# Patient Record
Sex: Male | Born: 2000 | Race: Black or African American | Hispanic: No | Marital: Single | Smoking: Former smoker
Health system: Southern US, Community
[De-identification: ages and names within clinical notes are randomized; demographics above are authoritative.]

## PROBLEM LIST (undated history)

## (undated) DIAGNOSIS — Z789 Other specified health status: Secondary | ICD-10-CM

## (undated) DIAGNOSIS — J45909 Unspecified asthma, uncomplicated: Secondary | ICD-10-CM

## (undated) HISTORY — PX: HIP SURGERY: SHX245

## (undated) HISTORY — DX: Other specified health status: Z78.9

---
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
## 2020-02-14 ENCOUNTER — Other Ambulatory Visit: Payer: Self-pay | Admitting: Nurse Practitioner

## 2020-02-14 ENCOUNTER — Ambulatory Visit
Admission: RE | Admit: 2020-02-14 | Discharge: 2020-02-14 | Disposition: A | Discharge status: Home / Self Care | Payer: No Typology Code available for payment source | Source: Ambulatory Visit | Attending: Nurse Practitioner | Admitting: Nurse Practitioner

## 2020-02-14 DIAGNOSIS — Z021 Encounter for pre-employment examination: Secondary | ICD-10-CM

## 2021-05-04 ENCOUNTER — Encounter (HOSPITAL_COMMUNITY): Payer: Self-pay

## 2021-05-04 ENCOUNTER — Other Ambulatory Visit: Payer: Self-pay

## 2021-05-04 ENCOUNTER — Emergency Department (HOSPITAL_COMMUNITY): Payer: 59

## 2021-05-04 ENCOUNTER — Emergency Department (HOSPITAL_COMMUNITY)
Admission: EM | Admit: 2021-05-04 | Discharge: 2021-05-04 | Disposition: A | Discharge status: Home / Self Care | Payer: 59 | Attending: Emergency Medicine | Admitting: Emergency Medicine

## 2021-05-04 DIAGNOSIS — N433 Hydrocele, unspecified: Secondary | ICD-10-CM | POA: Insufficient documentation

## 2021-05-04 DIAGNOSIS — N50812 Left testicular pain: Secondary | ICD-10-CM | POA: Diagnosis present

## 2021-05-04 NOTE — ED Notes (Signed)
Unable to provide urine specimen.  

## 2021-05-04 NOTE — Discharge Instructions (Addendum)
You are seen in the emergency department due to possible lump to your left testicle.  Your physical exam was overall reassuring.  Your ultrasound did show findings of a very small hydrocele, please see attached handout.   Please follow-up with urology, call tomorrow for soonest available follow-up appointment.  We have also provided a phone number to call to establish local primary care.  Return to the emergency department for new or worsening symptoms including but not limited to new or worsening pain, fever, problems urinating, penile discharge, testicular swelling, pain with bowel movements, or any other concerns.

## 2021-05-04 NOTE — ED Notes (Signed)
Ultrasound at bedside

## 2021-05-04 NOTE — ED Provider Notes (Signed)
[facility]-EMERGENCY DEPT Provider Note   CSN: [id number] Arrival date & time: 05/04/21  0174     History Chief Complaint  Patient presents with   Testicle Pain    Alex Wade is a 20 y.o. male without significant past medical hx who presents to the ED with concern for lump to his left testicle that he noted within the past 24 hours. States the area is uncomfortable but not particularly painful. No alleviating/aggravating factors. Denies injury, fever, chills, redness, drainage, N/V, abdominal pain, dysuria, penile discharge, or concern for STI.   HPI     History reviewed. No pertinent past medical history.  There are no problems to display for this patient.   History reviewed. No pertinent surgical history.     History reviewed. No pertinent family history.  Social History   Tobacco Use   Smoking status: Never   Smokeless tobacco: Never  Substance Use Topics   Alcohol use: Never   Drug use: Never    Home Medications Prior to Admission medications   Not on File    Allergies    Naproxen  Review of Systems   Review of Systems  Constitutional:  Negative for chills and fever.  Gastrointestinal:  Negative for abdominal pain, nausea, rectal pain and vomiting.  Genitourinary:  Positive for testicular pain (discomfort with lump). Negative for dysuria, flank pain, frequency, genital sores, hematuria, penile discharge, scrotal swelling and urgency.  All other systems reviewed and are negative.  Physical Exam Updated Vital Signs BP (!) 144/93 (BP Location: Right Arm)   Pulse 68   Temp 98.7 F (37.1 C) (Oral)   Resp 18   Ht 5\' 10"  (1.778 m)   Wt 64.4 kg   SpO2 100%   BMI 20.37 kg/m   Physical Exam Vitals and nursing note reviewed. Exam conducted with a chaperone present.  Constitutional:      General: He is not in acute distress.    Appearance: He is well-developed. He is not toxic-appearing.  HENT:     Head: Normocephalic and  atraumatic.  Eyes:     General:        Right eye: No discharge.        Left eye: No discharge.     Conjunctiva/sclera: Conjunctivae normal.  Cardiovascular:     Rate and Rhythm: Normal rate and regular rhythm.  Pulmonary:     Effort: Pulmonary effort is normal. No respiratory distress.     Breath sounds: Normal breath sounds. No wheezing, rhonchi or rales.  Abdominal:     General: There is no distension.     Palpations: Abdomen is soft.     Tenderness: There is no abdominal tenderness.  Genitourinary:    Pubic Area: No rash.      Penis: No phimosis, paraphimosis, erythema, discharge or swelling.      Testes:        Right: Mass or tenderness not present.        Left: Mass or tenderness not present.     Epididymis:     Right: No tenderness.     Left: No tenderness.     Comments: No erythema/drainage/warmth/fluctuance/induration. Nontender.  NT present as chaperone.  Musculoskeletal:     Cervical back: Neck supple.  Skin:    General: Skin is warm and dry.     Findings: No rash.  Neurological:     Mental Status: He is alert.     Comments: Clear speech.   Psychiatric:  Behavior: Behavior normal.    ED Results / Procedures / Treatments   Labs (all labs ordered are listed, but only abnormal results are displayed) Labs Reviewed  URINALYSIS, ROUTINE W REFLEX MICROSCOPIC    EKG None  Radiology US SCROTUM W/DOPPLER  Result Date: 05/04/2021 CLINICAL DATA:  20 year old male with testicular mass. EXAM: SCROTAL ULTRASOUND DOPPLER ULTRASOUND OF THE TESTICLES TECHNIQUE: Complete ultrasound examination of the testicles, epididymis, and other scrotal structures was performed. Color and spectral Doppler ultrasound were also utilized to evaluate blood flow to the testicles. COMPARISON:  None. FINDINGS: Right testicle Measurements: 4.9 x 2.0 x 3.1 cm. No mass or microlithiasis visualized. Left testicle Measurements: 4.9 x 1.9 x 2.8 cm. No mass or microlithiasis visualized.  Right epididymis:  Normal in size and appearance. Left epididymis:  Normal in size and appearance. Hydrocele:  Trace left side hydrocele (image 46). Varicocele:  None visualized. Pulsed Doppler interrogation of both testes demonstrates normal low resistance arterial and venous waveforms bilaterally. Other findings: The patient was unable to palpate, locate the area of the mass for this exam. IMPRESSION: 1. Negative Scrotal Ultrasound with Doppler; trace left hydrocele but negative for testicular mass or torsion. 2. The patient was unable to palpate the area of the mass for this exam. Electronically Signed   By: Odessa Fleming M.D.   On: 05/04/2021 04:58    Procedures Procedures   Medications Ordered in ED Medications - No data to display  ED Course  I have reviewed the triage vital signs and the nursing notes.  Pertinent labs & imaging results that were available during my care of the patient were reviewed by me and considered in my medical decision making (see chart for details).    MDM Rules/Calculators/A&P                           Patient presents to the ED with complaints of concern for left testicular lump. Nontoxic, vitals w/ elevated BP- doubt HTN emergency.Exam is benign.   Additional history obtained:  Additional history obtained from chart review & nursing note review.   Imaging Studies ordered:  I ordered imaging studies which included testicular US w/ doppler, I independently reviewed, formal radiology impression shows:  1. Negative Scrotal Ultrasound with Doppler; trace left hydrocele but negative for testicular mass or torsion. 2. The patient was unable to palpate the area of the mass for this exam  ED Course:  Exam w/o findings of infection- no cellulitis/abscess.  [place] without torsion or mass.  [place] with trace left hydrocele.  Discussed [place] results and subsequently discussed UA ordered, patient without urinary sxs or penile discharge or recent sexual activity/concern for STI, he  would like to just go home as he does not feel he needs to urinate, I have a very low suspicion for STI, orchitis, epididymitis, or UTI based on exam, [place], and sxs. Overall appears appropriate for discharge home, will provide urology follow up. I discussed results, treatment plan, need for follow-up, and return precautions with the patient. Provided opportunity for questions, patient confirmed understanding and is in agreement with plan.   Portions of this note were generated with Scientist, clinical (histocompatibility and immunogenetics). Dictation errors may occur despite best attempts at proofreading.  Final Clinical Impression(s) / ED Diagnoses Final diagnoses:  Hydrocele, unspecified hydrocele type    Rx / DC Orders ED Discharge Orders     None        Cherly Anderson, PA-C 05/04/21 [phone]  Gilda Crease, MD 05/04/21 [phone]

## 2021-05-04 NOTE — ED Triage Notes (Signed)
Pt reports feeling a lump on testicles today. Uncomfortable but denies pain. Denies injury.Pt unable to visualize.

## 2021-05-20 ENCOUNTER — Encounter (HOSPITAL_COMMUNITY): Payer: Self-pay | Admitting: Emergency Medicine

## 2021-05-20 ENCOUNTER — Emergency Department (HOSPITAL_COMMUNITY): Payer: No Typology Code available for payment source

## 2021-05-20 ENCOUNTER — Other Ambulatory Visit: Payer: Self-pay

## 2021-05-20 ENCOUNTER — Emergency Department (HOSPITAL_COMMUNITY)
Admission: EM | Admit: 2021-05-20 | Discharge: 2021-05-20 | Disposition: A | Discharge status: Home / Self Care | Payer: No Typology Code available for payment source | Attending: Emergency Medicine | Admitting: Emergency Medicine

## 2021-05-20 DIAGNOSIS — S0101XA Laceration without foreign body of scalp, initial encounter: Secondary | ICD-10-CM | POA: Insufficient documentation

## 2021-05-20 DIAGNOSIS — Y92410 Unspecified street and highway as the place of occurrence of the external cause: Secondary | ICD-10-CM | POA: Insufficient documentation

## 2021-05-20 DIAGNOSIS — S0990XA Unspecified injury of head, initial encounter: Secondary | ICD-10-CM | POA: Diagnosis present

## 2021-05-20 DIAGNOSIS — J45909 Unspecified asthma, uncomplicated: Secondary | ICD-10-CM | POA: Insufficient documentation

## 2021-05-20 HISTORY — DX: Unspecified asthma, uncomplicated: J45.909

## 2021-05-20 MED ORDER — LIDOCAINE-EPINEPHRINE (PF) 2 %-1:200000 IJ SOLN
10.0000 mL | Freq: Once | INTRAMUSCULAR | Status: AC
  Administered 2021-05-20: 10 mL
  Filled 2021-05-20: qty 20

## 2021-05-20 MED ORDER — TETANUS-DIPHTH-ACELL PERTUSSIS 5-2.5-18.5 LF-MCG/0.5 IM SUSY
0.5000 mL | PREFILLED_SYRINGE | Freq: Once | INTRAMUSCULAR | Status: DC
  Filled 2021-05-20: qty 0.5

## 2021-05-20 MED ORDER — METHOCARBAMOL 500 MG PO TABS: 500.0000 mg | ORAL_TABLET | Freq: Two times a day (BID) | ORAL | 0 refills | Status: DC
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
## 2021-05-20 NOTE — Discharge Instructions (Addendum)
1. Medications: Tylenol or ibuprofen for pain, usual home medications °2. Treatment: ice for swelling, keep wound clean with warm soap and water and keep bandage dry, do not submerge in water for 24 hours °3. Follow Up: Please return in 5-7 days to have your stitches/staples removed or sooner if you have concerns. Return to the emergency department for increased redness, drainage of pus from the wound ° ° °WOUND CARE °• Keep area clean and dry for 24 hours. Do not remove bandage, if applied. °• After 24 hours, remove bandage and wash wound gently with mild soap and warm water. Reapply a new bandage after cleaning wound, if directed.  °• Continue daily cleansing with soap and water until stitches/staples are removed. °• Do not apply any ointments or creams to the wound while stitches/staples are in place, as this may cause delayed healing. °•Return if you experience any of the following signs of infection: Swelling, redness, pus drainage, streaking, fever >101.0 F °• Return if you experience excessive bleeding that does not stop after 15-20 minutes of constant, firm pressure. ° °
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
## 2021-09-24 ENCOUNTER — Other Ambulatory Visit: Payer: Self-pay

## 2021-09-24 ENCOUNTER — Encounter (HOSPITAL_BASED_OUTPATIENT_CLINIC_OR_DEPARTMENT_OTHER): Payer: Self-pay | Admitting: Emergency Medicine

## 2021-09-24 ENCOUNTER — Emergency Department (HOSPITAL_BASED_OUTPATIENT_CLINIC_OR_DEPARTMENT_OTHER)
Admission: EM | Admit: 2021-09-24 | Discharge: 2021-09-24 | Disposition: A | Discharge status: Home / Self Care | Payer: 59 | Attending: Emergency Medicine | Admitting: Emergency Medicine

## 2021-09-24 DIAGNOSIS — R519 Headache, unspecified: Secondary | ICD-10-CM | POA: Diagnosis not present

## 2021-09-24 DIAGNOSIS — E860 Dehydration: Secondary | ICD-10-CM

## 2021-09-24 DIAGNOSIS — Z20822 Contact with and (suspected) exposure to covid-19: Secondary | ICD-10-CM | POA: Insufficient documentation

## 2021-09-24 DIAGNOSIS — R000 Tachycardia, unspecified: Secondary | ICD-10-CM | POA: Insufficient documentation

## 2021-09-24 DIAGNOSIS — R197 Diarrhea, unspecified: Secondary | ICD-10-CM | POA: Insufficient documentation

## 2021-09-24 DIAGNOSIS — R1110 Vomiting, unspecified: Secondary | ICD-10-CM | POA: Insufficient documentation

## 2021-09-24 DIAGNOSIS — R531 Weakness: Secondary | ICD-10-CM | POA: Diagnosis not present

## 2021-09-24 DIAGNOSIS — R109 Unspecified abdominal pain: Secondary | ICD-10-CM | POA: Insufficient documentation

## 2021-09-24 LAB — CBC
HCT: 47.6 % (ref 39.0–52.0)
Hemoglobin: 16.5 g/dL (ref 13.0–17.0)
MCH: 30.6 pg (ref 26.0–34.0)
MCHC: 34.7 g/dL (ref 30.0–36.0)
MCV: 88.1 fL (ref 80.0–100.0)
Platelets: 195 10*3/uL (ref 150–400)
RBC: 5.4 MIL/uL (ref 4.22–5.81)
RDW: 12.7 % (ref 11.5–15.5)
WBC: 10.5 10*3/uL (ref 4.0–10.5)
nRBC: 0 % (ref 0.0–0.2)

## 2021-09-24 LAB — COMPREHENSIVE METABOLIC PANEL
ALT: 27 U/L (ref 0–44)
AST: 25 U/L (ref 15–41)
Albumin: 4.8 g/dL (ref 3.5–5.0)
Alkaline Phosphatase: 80 U/L (ref 38–126)
Anion gap: 12 (ref 5–15)
BUN: 18 mg/dL (ref 6–20)
CO2: 22 mmol/L (ref 22–32)
Calcium: 10.4 mg/dL — ABNORMAL HIGH (ref 8.9–10.3)
Chloride: 102 mmol/L (ref 98–111)
Creatinine, Ser: 1.09 mg/dL (ref 0.61–1.24)
GFR, Estimated: >60 mL/min (ref >60)
Glucose, Bld: 103 mg/dL — ABNORMAL HIGH (ref 70–99)
Potassium: 4.4 mmol/L (ref 3.5–5.1)
Sodium: 136 mmol/L (ref 135–145)
Total Bilirubin: 1.1 mg/dL (ref 0.3–1.2)
Total Protein: 7.8 g/dL (ref 6.5–8.1)

## 2021-09-24 LAB — RESP PANEL BY RT-PCR (FLU A&B, COVID) ARPGX2
Influenza A by PCR: NEGATIVE
Influenza B by PCR: NEGATIVE
SARS Coronavirus 2 by RT PCR: NEGATIVE

## 2021-09-24 LAB — LIPASE, BLOOD: Lipase: 13 U/L (ref 11–51)

## 2021-09-24 MED ORDER — LACTATED RINGERS IV SOLN: INTRAVENOUS | Status: DC

## 2021-09-24 MED ORDER — KETOROLAC TROMETHAMINE 30 MG/ML IJ SOLN
30.0000 mg | Freq: Once | INTRAMUSCULAR | Status: AC
  Administered 2021-09-24: 30 mg via INTRAVENOUS
  Filled 2021-09-24: qty 1

## 2021-09-24 MED ORDER — METOCLOPRAMIDE HCL 5 MG/ML IJ SOLN
5.0000 mg | Freq: Once | INTRAMUSCULAR | Status: AC
  Administered 2021-09-24: 5 mg via INTRAVENOUS
  Filled 2021-09-24: qty 2

## 2021-09-24 MED ORDER — LOPERAMIDE HCL 2 MG PO CAPS
2.0000 mg | ORAL_CAPSULE | ORAL | Status: DC | PRN
  Administered 2021-09-24: 2 mg via ORAL
  Filled 2021-09-24: qty 1

## 2021-09-24 MED ORDER — LACTATED RINGERS IV BOLUS
1000.0000 mL | Freq: Once | INTRAVENOUS | Status: AC
  Administered 2021-09-24: 1000 mL via INTRAVENOUS

## 2021-09-24 MED ORDER — ONDANSETRON HCL 4 MG/2ML IJ SOLN
4.0000 mg | Freq: Once | INTRAMUSCULAR | Status: AC
  Administered 2021-09-24: 4 mg via INTRAVENOUS
  Filled 2021-09-24: qty 2

## 2021-09-24 MED ORDER — ONDANSETRON 8 MG PO TBDP: 8.0000 mg | ORAL_TABLET | Freq: Three times a day (TID) | ORAL | 0 refills | Status: DC | PRN

## 2021-09-24 MED ORDER — LACTATED RINGERS IV BOLUS
2000.0000 mL | Freq: Once | INTRAVENOUS | Status: AC
  Administered 2021-09-24: 2000 mL via INTRAVENOUS

## 2021-09-24 MED ORDER — DIPHENOXYLATE-ATROPINE 2.5-0.025 MG PO TABS: 1.0000 | ORAL_TABLET | Freq: Four times a day (QID) | ORAL | 0 refills | Status: DC | PRN

## 2021-09-24 NOTE — ED Triage Notes (Signed)
Pt arrives to ED with c/o emesis and diarrhea. Pt reports that started suddenly today at 8am. Emesis >10x and diarrhea >10x. Associated symptoms include abd pain which started after emesis onset. Emesis is green tinged. Pt reports eating chipotle yesterday and it upsetting his stomach.

## 2021-09-24 NOTE — ED Provider Notes (Signed)
[facility] EMERGENCY DEPT Provider Note   CSN: [id number] Arrival date & time: 09/24/21  1227     History  Chief Complaint  Patient presents with   Emesis   Diarrhea    Alex Wade is a 21 y.o. male.  21 year old male presents with cute onset of vomiting or diarrhea.  States he may have had a bad food exposure.  Diarrhea is been watery and his emesis been nonbilious.  Some abdominal cramping but no persistent pain.  Denies any fever or chills.  No URI symptoms.  Does endorse some weakness.  No treatment use prior to arrival      Home Medications Prior to Admission medications   Medication Sig Start Date End Date Taking? Authorizing Provider  methocarbamol (ROBAXIN) 500 MG tablet Take 1 tablet (500 mg total) by mouth 2 (two) times daily. 05/20/21   Muthersbaugh, Dahlia Client, PA-C      Allergies    Naproxen    Review of Systems   Review of Systems  All other systems reviewed and are negative.  Physical Exam Updated Vital Signs BP 115/61 (BP Location: Left Arm)    Pulse (!) 121    Temp 99.5 F (37.5 C)    Resp 16    Ht 1.778 m (5\' 10" )    Wt 63.5 kg    SpO2 100%    BMI 20.09 kg/m  Physical Exam Vitals and nursing note reviewed.  Constitutional:      General: He is not in acute distress.    Appearance: Normal appearance. He is well-developed. He is not toxic-appearing.  HENT:     Head: Normocephalic and atraumatic.  Eyes:     General: Lids are normal.     Conjunctiva/sclera: Conjunctivae normal.     Pupils: Pupils are equal, round, and reactive to light.  Neck:     Thyroid: No thyroid mass.     Trachea: No tracheal deviation.  Cardiovascular:     Rate and Rhythm: Regular rhythm. Tachycardia present.     Heart sounds: Normal heart sounds. No murmur heard.   No gallop.  Pulmonary:     Effort: Pulmonary effort is normal. No respiratory distress.     Breath sounds: Normal breath sounds. No stridor. No decreased breath sounds, wheezing, rhonchi or rales.   Abdominal:     General: There is no distension.     Palpations: Abdomen is soft.     Tenderness: There is no abdominal tenderness. There is no rebound.     Comments: No abdominal tenderness  Musculoskeletal:        General: No tenderness. Normal range of motion.     Cervical back: Normal range of motion and neck supple.  Skin:    General: Skin is warm and dry.     Findings: No abrasion or rash.  Neurological:     Mental Status: He is alert and oriented to person, place, and time. Mental status is at baseline.     GCS: GCS eye subscore is 4. GCS verbal subscore is 5. GCS motor subscore is 6.     Cranial Nerves: No cranial nerve deficit.     Sensory: No sensory deficit.     Motor: Motor function is intact.  Psychiatric:        Attention and Perception: Attention normal.        Speech: Speech normal.        Behavior: Behavior normal.    ED Results / Procedures / Treatments   Labs (all  labs ordered are listed, but only abnormal results are displayed) Labs Reviewed  CBC  LIPASE, BLOOD  COMPREHENSIVE METABOLIC PANEL  URINALYSIS, ROUTINE W REFLEX MICROSCOPIC    EKG None  Radiology No results found.  Procedures Procedures    Medications Ordered in ED Medications  lactated ringers bolus 2,000 mL (has no administration in time range)  lactated ringers infusion (has no administration in time range)  ondansetron (ZOFRAN) injection 4 mg (has no administration in time range)  loperamide (IMODIUM) capsule 2 mg (has no administration in time range)    ED Course/ Medical Decision Making/ A&P                           Medical Decision Making  Patient appeared be clinically hydrated here and given 3 L of lactated Ringer's.  Patient also had mild headache that was treated with IV Reglan and Toradol.  He is much improved at this time.  COVID and flu are negative.  Heart rate was initially elevated but improved with fluids and treatment.  He feels much better at this time.  He now  appears to be dehydrated will discharge home        Final Clinical Impression(s) / ED Diagnoses Final diagnoses:  None    Rx / DC Orders ED Discharge Orders     None         Lorre Nick, MD 09/24/21 1513

## 2021-10-10 ENCOUNTER — Ambulatory Visit: Payer: Self-pay | Admitting: Internal Medicine

## 2021-11-26 ENCOUNTER — Encounter (HOSPITAL_COMMUNITY): Payer: Self-pay

## 2021-11-26 ENCOUNTER — Emergency Department (HOSPITAL_COMMUNITY)
Admission: EM | Admit: 2021-11-26 | Discharge: 2021-11-27 | Disposition: A | Discharge status: Home / Self Care | Payer: No Typology Code available for payment source | Attending: Emergency Medicine | Admitting: Emergency Medicine

## 2021-11-26 ENCOUNTER — Emergency Department (HOSPITAL_COMMUNITY): Payer: 59

## 2021-11-26 DIAGNOSIS — S50811A Abrasion of right forearm, initial encounter: Secondary | ICD-10-CM | POA: Insufficient documentation

## 2021-11-26 DIAGNOSIS — Y990 Civilian activity done for income or pay: Secondary | ICD-10-CM | POA: Diagnosis not present

## 2021-11-26 DIAGNOSIS — T07XXXA Unspecified multiple injuries, initial encounter: Secondary | ICD-10-CM

## 2021-11-26 DIAGNOSIS — S50812A Abrasion of left forearm, initial encounter: Secondary | ICD-10-CM | POA: Insufficient documentation

## 2021-11-26 DIAGNOSIS — S41151A Open bite of right upper arm, initial encounter: Secondary | ICD-10-CM | POA: Insufficient documentation

## 2021-11-26 DIAGNOSIS — S80212A Abrasion, left knee, initial encounter: Secondary | ICD-10-CM | POA: Insufficient documentation

## 2021-11-26 DIAGNOSIS — T148XXA Other injury of unspecified body region, initial encounter: Secondary | ICD-10-CM

## 2021-11-26 DIAGNOSIS — R7401 Elevation of levels of liver transaminase levels: Secondary | ICD-10-CM | POA: Insufficient documentation

## 2021-11-26 MED ORDER — AMOXICILLIN-POT CLAVULANATE 875-125 MG PO TABS
1.0000 | ORAL_TABLET | Freq: Once | ORAL | Status: AC
  Administered 2021-11-26: 1 via ORAL
  Filled 2021-11-26: qty 1

## 2021-11-26 MED ORDER — ELVITEG-COBIC-EMTRICIT-TENOFAF 150-150-200-10 MG PREPACK
1.0000 | ORAL_TABLET | Freq: Once | ORAL | Status: AC
  Administered 2021-11-27: 1 via ORAL
  Filled 2021-11-26: qty 1

## 2021-11-26 MED ORDER — ELVITEG-COBIC-EMTRICIT-TENOFAF 150-150-200-10 MG PO TABS: 1.0000 | ORAL_TABLET | Freq: Every day | ORAL | 0 refills | Status: DC
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
## 2021-11-27 ENCOUNTER — Telehealth (INDEPENDENT_AMBULATORY_CARE_PROVIDER_SITE_OTHER): Payer: 59 | Admitting: Infectious Disease

## 2021-11-27 DIAGNOSIS — B20 Human immunodeficiency virus [HIV] disease: Secondary | ICD-10-CM

## 2021-11-27 LAB — COMPREHENSIVE METABOLIC PANEL
ALT: 34 U/L (ref 0–44)
AST: 42 U/L — ABNORMAL HIGH (ref 15–41)
Albumin: 4.6 g/dL (ref 3.5–5.0)
Alkaline Phosphatase: 87 U/L (ref 38–126)
Anion gap: 9 (ref 5–15)
BUN: 11 mg/dL (ref 6–20)
CO2: 26 mmol/L (ref 22–32)
Calcium: 9.6 mg/dL (ref 8.9–10.3)
Chloride: 101 mmol/L (ref 98–111)
Creatinine, Ser: 0.97 mg/dL (ref 0.61–1.24)
GFR, Estimated: >60 mL/min (ref >60)
Glucose, Bld: 92 mg/dL (ref 70–99)
Potassium: 3.6 mmol/L (ref 3.5–5.1)
Sodium: 136 mmol/L (ref 135–145)
Total Bilirubin: 0.2 mg/dL — ABNORMAL LOW (ref 0.3–1.2)
Total Protein: 7.6 g/dL (ref 6.5–8.1)

## 2021-11-27 LAB — HEPATITIS C ANTIBODY: HCV Ab: NONREACTIVE

## 2021-11-27 LAB — HEPATITIS B SURFACE ANTIGEN: Hepatitis B Surface Ag: NONREACTIVE

## 2021-11-27 LAB — RPR: RPR Ser Ql: NONREACTIVE

## 2021-11-27 MED ORDER — ELVITEG-COBIC-EMTRICIT-TENOFAF 150-150-200-10 MG PO TABS: 1.0000 | ORAL_TABLET | Freq: Every day | ORAL | 0 refills | Status: DC

## 2021-11-27 MED ORDER — AMOXICILLIN-POT CLAVULANATE 875-125 MG PO TABS: 1.0000 | ORAL_TABLET | Freq: Two times a day (BID) | ORAL | 0 refills | Status: DC
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
## 2021-11-28 LAB — RAPID HIV SCREEN (HIV 1/2 AB+AG)
HIV 1/2 Antibodies: NONREACTIVE
HIV-1 P24 Antigen - HIV24: NONREACTIVE

## 2021-11-28 NOTE — Telephone Encounter (Signed)
Not certain why there is no open encounter for this gentleman ?

## 2022-06-17 ENCOUNTER — Encounter: Payer: Self-pay | Admitting: Family Medicine

## 2022-06-17 ENCOUNTER — Ambulatory Visit: Payer: 59 | Admitting: Family Medicine

## 2022-06-17 VITALS — BP 108/64 | HR 71 | Temp 98.2°F | Ht 70.0 in | Wt 145.0 lb

## 2022-06-17 DIAGNOSIS — G4700 Insomnia, unspecified: Secondary | ICD-10-CM | POA: Diagnosis not present

## 2022-06-17 DIAGNOSIS — R627 Adult failure to thrive: Secondary | ICD-10-CM

## 2022-06-17 MED ORDER — MIRTAZAPINE 15 MG PO TABS: 15.0000 mg | ORAL_TABLET | Freq: Every day | ORAL | 2 refills | Status: DC

## 2022-06-17 NOTE — Patient Instructions (Addendum)
Give [place] 2-3 business days to get the results of your labs back.   Stay active.   Sleep is important to [place] all. Getting good sleep is imperative to adequate functioning during the day. Work with our counselors who are trained to help people obtain quality sleep. Call [phone] to schedule an appointment or if you are curious about insurance coverage/cost.  Sleep Hygiene Tips: Do not watch TV or look at screens within 1 hour of going to bed. If you do, make sure there is a blue light filter (nighttime mode) involved. Try to go to bed around the same time every night. Wake up at the same time within 1 hour of regular time. Ex: If you wake up at 7 AM for work, do not sleep past 8 AM on days that you don't work. Do not drink alcohol before bedtime. Do not consume caffeine-containing beverages after noon or within 9 hours of intended bedtime. Get regular exercise/physical activity in your life, but not within 2 hours of planned bedtime. Do not take naps.  Do not eat within 2 hours of planned bedtime. Melatonin, 3-5 mg 30-60 minutes before planned bedtime may be helpful.  The bed should be for sleep or sex only. If after 20-30 minutes you are unable to fall asleep, get up and do something relaxing. Do this until you feel ready to go to sleep again.   Let us know if you need anything.

## 2022-06-17 NOTE — Progress Notes (Signed)
Chief Complaint  Patient presents with   New Patient (Initial Visit)    Sleep problems Does not have a good appetite        New Patient Visit SUBJECTIVE: HPI: Alex Wade is an 21 y.o.male who is being seen for establishing care.  Has trouble falling asleep and staying asleep since starting nightshift for work around 10-11 months. Feels fatigued more lately. Gets around 4-6 hrs nightly of sleep. He does not snore at night. Exercises routinely with cardio and wt lifting. No hx of anxiety/depression. He tried Melatonin which did not help.   Over the past 6 weeks, the patient's appetite has been decreasing.  He is trying to gain muscle mass but has trouble getting in the designated amount of calories he plans.  He does not have any abdominal pain, nausea, vomiting, weight loss, or bowel changes.  He does not feel particularly anxious.  He has no difficulty swallowing.  Past Medical History:  Diagnosis Date   No known health problems    Past Surgical History:  Procedure Laterality Date   HIP SURGERY Right 2020   labral   OTHER SURGICAL HISTORY Left 2003   tibial fx   Family History  Problem Relation Age of Onset   Cancer Neg Hx    Heart disease Neg Hx    Allergies  Allergen Reactions   Naproxen Nausea And Vomiting    Current Outpatient Medications:    mirtazapine (REMERON) 15 MG tablet, Take 1 tablet (15 mg total) by mouth at bedtime., Disp: 30 tablet, Rfl: 2  OBJECTIVE: BP 108/64 (BP Location: Left Arm, Patient Position: Sitting, Cuff Size: Normal)   Pulse 71   Temp 98.2 F (36.8 C) (Oral)   Ht 5\' 10"  (1.778 m)   Wt 145 lb (65.8 kg)   SpO2 98%   BMI 20.81 kg/m  General:  well developed, well nourished, in no apparent distress Skin:  no significant moles, warts, or growths Lungs:  clear to auscultation, breath sounds equal bilaterally, no respiratory distress Cardio:  regular rate and rhythm, no LE edema or bruits Abdomen: Bowel sounds present, soft, nontender,  non-distended Neuro:  gait normal Psych: well oriented with normal range of affect and appropriate judgment/insight  ASSESSMENT/PLAN: Poor weight gain in adult - Plan: TSH, T4, free, Comprehensive metabolic panel, CBC, mirtazapine (REMERON) 15 MG tablet  Insomnia, unspecified type - Plan: mirtazapine (REMERON) 15 MG tablet  Chronic, uncontrolled.  Check above labs.  Start Remeron 15 mg before bed. Chronic, uncontrolled.  Start Remeron 50 mg before bed. Patient should return in 1 month to recheck above. The patient voiced understanding and agreement to the plan.   [place], DO 06/17/22  4:53 PM

## 2022-06-18 LAB — COMPREHENSIVE METABOLIC PANEL
ALT: 20 U/L (ref 0–53)
AST: 21 U/L (ref 0–37)
Albumin: 4.7 g/dL (ref 3.5–5.2)
Alkaline Phosphatase: 76 U/L (ref 39–117)
BUN: 13 mg/dL (ref 6–23)
CO2: 30 mEq/L (ref 19–32)
Calcium: 10 mg/dL (ref 8.4–10.5)
Chloride: 101 mEq/L (ref 96–112)
Creatinine, Ser: 1.03 mg/dL (ref 0.40–1.50)
GFR: 102.94 mL/min (ref >60.00)
Glucose, Bld: 81 mg/dL (ref 70–99)
Potassium: 4.3 mEq/L (ref 3.5–5.1)
Sodium: 139 mEq/L (ref 135–145)
Total Bilirubin: 0.7 mg/dL (ref 0.2–1.2)
Total Protein: 7.7 g/dL (ref 6.0–8.3)

## 2022-06-18 LAB — CBC
HCT: 45.2 % (ref 39.0–52.0)
Hemoglobin: 15.1 g/dL (ref 13.0–17.0)
MCHC: 33.3 g/dL (ref 30.0–36.0)
MCV: 91.2 fl (ref 78.0–100.0)
Platelets: 176 10*3/uL (ref 150.0–400.0)
RBC: 4.96 Mil/uL (ref 4.22–5.81)
RDW: 12.8 % (ref 11.5–15.5)
WBC: 5.2 10*3/uL (ref 4.0–10.5)

## 2022-06-18 LAB — TSH: TSH: 1.15 u[IU]/mL (ref 0.35–5.50)

## 2022-06-18 LAB — T4, FREE: Free T4: 1.01 ng/dL (ref 0.60–1.60)

## 2022-09-23 ENCOUNTER — Other Ambulatory Visit: Payer: Self-pay | Admitting: Family Medicine

## 2022-09-23 DIAGNOSIS — G4700 Insomnia, unspecified: Secondary | ICD-10-CM

## 2022-09-23 DIAGNOSIS — R627 Adult failure to thrive: Secondary | ICD-10-CM

## 2023-01-12 ENCOUNTER — Other Ambulatory Visit: Payer: Self-pay | Admitting: Family Medicine

## 2023-01-12 DIAGNOSIS — G4700 Insomnia, unspecified: Secondary | ICD-10-CM

## 2023-01-12 DIAGNOSIS — R627 Adult failure to thrive: Secondary | ICD-10-CM
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
## 2023-02-07 ENCOUNTER — Ambulatory Visit: Payer: 59 | Admitting: Family Medicine

## 2023-02-07 ENCOUNTER — Encounter: Payer: Self-pay | Admitting: Family Medicine

## 2023-02-07 VITALS — BP 110/78 | HR 47 | Temp 97.4°F | Ht 70.0 in | Wt 144.2 lb

## 2023-02-07 DIAGNOSIS — R519 Headache, unspecified: Secondary | ICD-10-CM

## 2023-02-07 DIAGNOSIS — G4700 Insomnia, unspecified: Secondary | ICD-10-CM

## 2023-02-07 MED ORDER — AMITRIPTYLINE HCL 10 MG PO TABS: 10.0000 mg | ORAL_TABLET | Freq: Every day | ORAL | 1 refills | Status: DC

## 2023-02-07 NOTE — Progress Notes (Signed)
Chief Complaint  Patient presents with   Headache    Sleep medication is not working    Insomnia    Subjective: Patient is a 22 y.o. male here for f/u insomnia.  Was taking Remeron 15 mg qhs and it stopped working over past 5 month. No obvious trigger or reason for this. Has tried Mg without relief. Has trouble falling asleep and staying sleep. Gets around 6 hrs of sleep nightly. Does not feel well rested. Has not tried any other rx meds. No hx of anxiety that he is aware of. No consistent alcohol use. He does not nap routinely. Associated amplification of headaches. Exacerbated by natural light. Going on for a little over a year. No neuro s/s's. Headband distribution.    Past Medical History:  Diagnosis Date   No known health problems     Objective: BP 110/78 (BP Location: Left Arm, Patient Position: Sitting, Cuff Size: Normal)   Pulse (!) 47   Temp (!) 97.4 F (36.3 C) (Oral)   Ht 5\' 10"  (1.778 m)   Wt 144 lb 4 oz (65.4 kg)   SpO2 99%   BMI 20.70 kg/m  General: Awake, appears stated age Heart: RRR, no LE edema Lungs: CTAB, no rales, wheezes or rhonchi. No accessory muscle use MSK: Tight traps b/l, no ttp over subocc triangle or cerv parasp msc b/l Neuro: DTR's equal and symmetric throughout, no clonus, no cerebellar signs, gait nml, 5/5 strength throughout Psych: Age appropriate judgment and insight, normal affect and mood  Assessment and Plan: Chronic daily headache - Plan: amitriptyline (ELAVIL) 10 MG tablet  Insomnia, unspecified type - Plan: amitriptyline (ELAVIL) 10 MG tablet  Chronic, unstable. Start Elavil 10 mg qhs prn. Heat, ice, Tylenol, neck stretches/exercises provided. Chronic, unstable. Stop Remeron, start Elavil 10 mg qhs prn. Sleep hygiene list provided. CBT info provided.  F/u in 1 mo to reck above.  The patient voiced understanding and agreement to the plan.  Jilda Roche [place], DO 02/07/23  11:18 AM

## 2023-02-07 NOTE — Patient Instructions (Signed)
Heat (pad or rice pillow in microwave) over affected area, 10-15 minutes twice daily.   Ice/cold pack over area for 10-15 min twice daily.  OK to take Tylenol 1000 mg (2 extra strength tabs) or 975 mg (3 regular strength tabs) every 6 hours as needed.  Sleep Hygiene Tips: Do not watch TV or look at screens within 1 hour of going to bed. If you do, make sure there is a blue light filter (nighttime mode) involved. Try to go to bed around the same time every night. Wake up at the same time within 1 hour of regular time. Ex: If you wake up at 7 AM for work, do not sleep past 8 AM on days that you don't work. Do not drink alcohol before bedtime. Do not consume caffeine-containing beverages after noon or within 9 hours of intended bedtime. Get regular exercise/physical activity in your life, but not within 2 hours of planned bedtime. Do not take naps.  Do not eat within 2 hours of planned bedtime. Melatonin, 3-5 mg 30-60 minutes before planned bedtime may be helpful.  The bed should be for sleep or sex only. If after 20-30 minutes you are unable to fall asleep, get up and do something relaxing. Do this until you feel ready to go to sleep again.   Sleep is important to [place] all. Getting good sleep is imperative to adequate functioning during the day. Work with our counselors who are trained to help people obtain quality sleep. Call [phone] to schedule an appointment or if you are curious about insurance coverage/cost.  Let us know if you need anything.  EXERCISES RANGE OF MOTION (ROM) AND STRETCHING EXERCISES  These exercises may help you when beginning to rehabilitate your issue. In order to successfully resolve your symptoms, you must improve your posture. These exercises are designed to help reduce the forward-head and rounded-shoulder posture which contributes to this condition. Your symptoms may resolve with or without further involvement from your physician, physical therapist or athletic  trainer. While completing these exercises, remember:  Restoring tissue flexibility helps normal motion to return to the joints. This allows healthier, less painful movement and activity. An effective stretch should be held for at least 20 seconds, although you may need to begin with shorter hold times for comfort. A stretch should never be painful. You should only feel a gentle lengthening or release in the stretched tissue. Do not do any stretch or exercise that you cannot tolerate.  STRETCH- Axial Extensors Lie on your back on the floor. You may bend your knees for comfort. Place a rolled-up hand towel or dish towel, about 2 inches in diameter, under the part of your head that makes contact with the floor. Gently tuck your chin, as if trying to make a "double chin," until you feel a gentle stretch at the base of your head. Hold 15-20 seconds. Repeat 2-3 times. Complete this exercise 1 time per day.   STRETCH - Axial Extension  Stand or sit on a firm surface. Assume a good posture: chest up, shoulders drawn back, abdominal muscles slightly tense, knees unlocked (if standing) and feet hip width apart. Slowly retract your chin so your head slides back and your chin slightly lowers. Continue to look straight ahead. You should feel a gentle stretch in the back of your head. Be certain not to feel an aggressive stretch since this can cause headaches later. Hold for 15-20 seconds. Repeat 2-3 times. Complete this exercise 1 time per day.  STRETCH -  Cervical Side Bend  Stand or sit on a firm surface. Assume a good posture: chest up, shoulders drawn back, abdominal muscles slightly tense, knees unlocked (if standing) and feet hip width apart. Without letting your nose or shoulders move, slowly tip your right / left ear to your shoulder until your feel a gentle stretch in the muscles on the opposite side of your neck. Hold 15-20 seconds. Repeat 2-3 times. Complete this exercise 1-2 times per  day.  STRETCH - Cervical Rotators  Stand or sit on a firm surface. Assume a good posture: chest up, shoulders drawn back, abdominal muscles slightly tense, knees unlocked (if standing) and feet hip width apart. Keeping your eyes level with the ground, slowly turn your head until you feel a gentle stretch along the back and opposite side of your neck. Hold 15-20 seconds. Repeat 2-3 times. Complete this exercise 1-2 times per day.  RANGE OF MOTION - Neck Circles  Stand or sit on a firm surface. Assume a good posture: chest up, shoulders drawn back, abdominal muscles slightly tense, knees unlocked (if standing) and feet hip width apart. Gently roll your head down and around from the back of one shoulder to the back of the other. The motion should never be forced or painful. Repeat the motion 10-20 times, or until you feel the neck muscles relax and loosen. Repeat 2-3 times. Complete the exercise 1-2 times per day. STRENGTHENING EXERCISES - Cervical Strain and Sprain These exercises may help you when beginning to rehabilitate your injury. They may resolve your symptoms with or without further involvement from your physician, physical therapist, or athletic trainer. While completing these exercises, remember:  Muscles can gain both the endurance and the strength needed for everyday activities through controlled exercises. Complete these exercises as instructed by your physician, physical therapist, or athletic trainer. Progress the resistance and repetitions only as guided. You may experience muscle soreness or fatigue, but the pain or discomfort you are trying to eliminate should never worsen during these exercises. If this pain does worsen, stop and make certain you are following the directions exactly. If the pain is still present after adjustments, discontinue the exercise until you can discuss the trouble with your clinician.  STRENGTH - Cervical Flexors, Isometric Face a wall, standing about 6  inches away. Place a small pillow, a ball about 6-8 inches in diameter, or a folded towel between your forehead and the wall. Slightly tuck your chin and gently push your forehead into the soft object. Push only with mild to moderate intensity, building up tension gradually. Keep your jaw and forehead relaxed. Hold 10 to 20 seconds. Keep your breathing relaxed. Release the tension slowly. Relax your neck muscles completely before you start the next repetition. Repeat 2-3 times. Complete this exercise 1 time per day.  STRENGTH- Cervical Lateral Flexors, Isometric  Stand about 6 inches away from a wall. Place a small pillow, a ball about 6-8 inches in diameter, or a folded towel between the side of your head and the wall. Slightly tuck your chin and gently tilt your head into the soft object. Push only with mild to moderate intensity, building up tension gradually. Keep your jaw and forehead relaxed. Hold 10 to 20 seconds. Keep your breathing relaxed. Release the tension slowly. Relax your neck muscles completely before you start the next repetition. Repeat 2-3 times. Complete this exercise 1 time per day.  STRENGTH - Cervical Extensors, Isometric  Stand about 6 inches away from a wall. Place a  small pillow, a ball about 6-8 inches in diameter, or a folded towel between the back of your head and the wall. Slightly tuck your chin and gently tilt your head back into the soft object. Push only with mild to moderate intensity, building up tension gradually. Keep your jaw and forehead relaxed. Hold 10 to 20 seconds. Keep your breathing relaxed. Release the tension slowly. Relax your neck muscles completely before you start the next repetition. Repeat 2-3 times. Complete this exercise 1 time per day.  POSTURE AND BODY MECHANICS CONSIDERATIONS Keeping correct posture when sitting, standing or completing your activities will reduce the stress put on different body tissues, allowing injured tissues a  chance to heal and limiting painful experiences. The following are general guidelines for improved posture. Your physician or physical therapist will provide you with any instructions specific to your needs. While reading these guidelines, remember: The exercises prescribed by your provider will help you have the flexibility and strength to maintain correct postures. The correct posture provides the optimal environment for your joints to work. All of your joints have less wear and tear when properly supported by a spine with good posture. This means you will experience a healthier, less painful body. Correct posture must be practiced with all of your activities, especially prolonged sitting and standing. Correct posture is as important when doing repetitive low-stress activities (typing) as it is when doing a single heavy-load activity (lifting).  PROLONGED STANDING WHILE SLIGHTLY LEANING FORWARD When completing a task that requires you to lean forward while standing in one place for a long time, place either foot up on a stationary 2- to 4-inch high object to help maintain the best posture. When both feet are on the ground, the low back tends to lose its slight inward curve. If this curve flattens (or becomes too large), then the back and your other joints will experience too much stress, fatigue more quickly, and can cause pain.   RESTING POSITIONS Consider which positions are most painful for you when choosing a resting position. If you have pain with flexion-based activities (sitting, bending, stooping, squatting), choose a position that allows you to rest in a less flexed posture. You would want to avoid curling into a fetal position on your side. If your pain worsens with extension-based activities (prolonged standing, working overhead), avoid resting in an extended position such as sleeping on your stomach. Most people will find more comfort when they rest with their spine in a more neutral position,  neither too rounded nor too arched. Lying on a non-sagging bed on your side with a pillow between your knees, or on your back with a pillow under your knees will often provide some relief. Keep in mind, being in any one position for a prolonged period of time, no matter how correct your posture, can still lead to stiffness.  WALKING Walk with an upright posture. Your ears, shoulders, and hips should all line up. OFFICE WORK When working at a desk, create an environment that supports good, upright posture. Without extra support, muscles fatigue and lead to excessive strain on joints and other tissues.  CHAIR: A chair should be able to slide under your desk when your back makes contact with the back of the chair. This allows you to work closely. The chair's height should allow your eyes to be level with the upper part of your monitor and your hands to be slightly lower than your elbows. Body position: Your feet should make contact with the  floor. If this is not possible, use a foot rest. Keep your ears over your shoulders. This will reduce stress on your neck and low back.

## 2023-03-07 ENCOUNTER — Encounter: Payer: Self-pay | Admitting: Family Medicine

## 2023-03-07 ENCOUNTER — Ambulatory Visit: Payer: 59 | Admitting: Family Medicine

## 2023-03-07 VITALS — BP 118/72 | HR 69 | Temp 98.4°F | Ht 70.0 in | Wt 142.2 lb

## 2023-03-07 DIAGNOSIS — G4700 Insomnia, unspecified: Secondary | ICD-10-CM

## 2023-03-07 MED ORDER — TRAZODONE HCL 50 MG PO TABS: 25.0000 mg | ORAL_TABLET | Freq: Every evening | ORAL | 3 refills | Status: DC | PRN

## 2023-03-07 NOTE — Patient Instructions (Addendum)
Stay active.  If you do not hear anything about your referral in the next 1-2 weeks, call our office and ask for an update.  Send me a message in 1-2 weeks if no improvement.   Let us know if you need anything.

## 2023-03-07 NOTE — Progress Notes (Signed)
Chief Complaint  Patient presents with   Insomnia    Alex Wade is a 22 y.o. male here for evaluation of CHD/insomnia.  Discussed the use of AI scribe software for clinical note transcription with the patient, who gave verbal consent to proceed.  History of Present Illness   The patient, with a history of insomnia and light sensitivity, presents after a trial of amitriptyline. He experienced 'feeling weird' and noted blood in their stool (1 week ago and has not returned), which they attributed to the medication. He denies constipation. Since discontinuing the amitriptyline, they have had no further episodes of bloody stool.  His mood has been negatively affected by their lack of sleep, leading to irritability and conflict with friends. He also reports increased light sensitivity, but deny worsening headaches. The pt has previously tried Remeron and magnesium for their insomnia without success.  The patient denies being anxious or having racing thoughts. The pt has not engaged with therapy, as he does not believe his insomnia is due to subconscious issues. He has been unable to exercise due to fatigue from lack of sleep.      BP 118/72 (BP Location: Left Arm, Patient Position: Sitting, Cuff Size: Normal)   Pulse 69   Temp 98.4 F (36.9 C) (Oral)   Ht 5\' 10"  (1.778 m)   Wt 142 lb 4 oz (64.5 kg)   SpO2 98%   BMI 20.41 kg/m  General: awake, alert, appearing stated age Lungs: No accessory muscle use Psych: Age appropriate judgment and insight, mood and affect normal  Insomnia, unspecified type - Plan: traZODone (DESYREL) 50 MG tablet, Ambulatory referral to Pulmonology  Chronic, unstable. Stop Elavil. Start trazodone 25-50 mg qhs prn. Refer pulm/sleep for further eval. He will send a message in 1-2 weeks if no better and will try Belsomra. F/u in 6 mo for CPE or prn.  The patient voiced understanding and agreement to the plan.  Jilda Roche Stiles, DO 12:10 PM 03/07/23

## 2023-03-16 ENCOUNTER — Encounter: Payer: Self-pay | Admitting: Family Medicine

## 2023-05-14 ENCOUNTER — Emergency Department (HOSPITAL_BASED_OUTPATIENT_CLINIC_OR_DEPARTMENT_OTHER): Payer: 59

## 2023-05-14 ENCOUNTER — Encounter (HOSPITAL_BASED_OUTPATIENT_CLINIC_OR_DEPARTMENT_OTHER): Payer: Self-pay | Admitting: Emergency Medicine

## 2023-05-14 ENCOUNTER — Other Ambulatory Visit: Payer: Self-pay

## 2023-05-14 ENCOUNTER — Emergency Department (HOSPITAL_BASED_OUTPATIENT_CLINIC_OR_DEPARTMENT_OTHER)
Admission: EM | Admit: 2023-05-14 | Discharge: 2023-05-14 | Disposition: A | Discharge status: Home / Self Care | Payer: 59 | Attending: Emergency Medicine | Admitting: Emergency Medicine

## 2023-05-14 DIAGNOSIS — S3992XA Unspecified injury of lower back, initial encounter: Secondary | ICD-10-CM | POA: Diagnosis present

## 2023-05-14 DIAGNOSIS — S39012A Strain of muscle, fascia and tendon of lower back, initial encounter: Secondary | ICD-10-CM | POA: Insufficient documentation

## 2023-05-14 DIAGNOSIS — Y9372 Activity, wrestling: Secondary | ICD-10-CM | POA: Insufficient documentation

## 2023-05-14 DIAGNOSIS — X500XXA Overexertion from strenuous movement or load, initial encounter: Secondary | ICD-10-CM | POA: Diagnosis not present

## 2023-05-14 LAB — URINALYSIS, ROUTINE W REFLEX MICROSCOPIC
Bilirubin Urine: NEGATIVE
Glucose, UA: NEGATIVE mg/dL
Hgb urine dipstick: NEGATIVE
Ketones, ur: NEGATIVE mg/dL
Leukocytes,Ua: NEGATIVE
Nitrite: NEGATIVE
Protein, ur: NEGATIVE mg/dL
Specific Gravity, Urine: >=1.03 (ref 1.005–1.030)
pH: 6.5 (ref 5.0–8.0)

## 2023-05-14 MED ORDER — METHOCARBAMOL 500 MG PO TABS: 500.0000 mg | ORAL_TABLET | Freq: Three times a day (TID) | ORAL | 0 refills | Status: DC | PRN

## 2023-05-14 MED ORDER — METHOCARBAMOL 500 MG PO TABS
500.0000 mg | ORAL_TABLET | Freq: Once | ORAL | Status: AC
  Administered 2023-05-14: 500 mg via ORAL
  Filled 2023-05-14: qty 1

## 2023-05-14 MED ORDER — KETOROLAC TROMETHAMINE 30 MG/ML IJ SOLN
15.0000 mg | Freq: Once | INTRAMUSCULAR | Status: AC
  Administered 2023-05-14: 15 mg via INTRAMUSCULAR
  Filled 2023-05-14: qty 1

## 2023-05-14 MED ORDER — IBUPROFEN 800 MG PO TABS: 800.0000 mg | ORAL_TABLET | Freq: Three times a day (TID) | ORAL | 0 refills | Status: DC | PRN

## 2023-05-14 MED ORDER — LIDOCAINE 5 % EX PTCH
1.0000 | MEDICATED_PATCH | CUTANEOUS | Status: DC
  Administered 2023-05-14: 1 via TRANSDERMAL
  Filled 2023-05-14: qty 1

## 2023-05-14 MED ORDER — LIDOCAINE 5 % EX PTCH: 1.0000 | MEDICATED_PATCH | CUTANEOUS | 0 refills | Status: DC

## 2023-05-14 NOTE — Discharge Instructions (Signed)
Your testing is reassuring.  Take the anti-inflammatories and muscle relaxers as prescribed.  Do not take methocarbamol if you are working or driving as it could make you sleepy.  Follow-up with your doctor.  Return to the ED for worsening pain, weakness, numbness, tingling, bowel or bladder incontinence or other concerns.

## 2023-05-14 NOTE — ED Triage Notes (Signed)
Pt states strained lower right back X 2 days. Was lifting weights and wrestling. Got worse last night.

## 2023-05-14 NOTE — ED Provider Notes (Signed)
[place] [facility] AT [facility] [place] Provider Note   CSN: [id number] Arrival date & time: 05/14/23  0301     History  No chief complaint on file.   Alex Wade is a 22 y.o. male.  Right-sided low back pain for the past 2 days.  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  Also has been lifting some weights.  Taking ibuprofen without relief.  Denies any history of back problems.  No previous back surgery.  Pain is right low back with some radiation down his right leg.  No focal weakness, numbness or tingling.  No fever, chills, nausea, vomiting, pain with urination or blood in the urine.  No bowel or bladder incontinence.  No history of IV drug abuse or cancer.  No abdominal pain.  No testicular pain.  Came in today because he has difficulty sleeping secondary to pain.  The history is provided by the patient.       Home Medications Prior to Admission medications   Medication Sig Start Date End Date Taking? Authorizing Provider  traZODone (DESYREL) 50 MG tablet Take 0.5-1 tablets (25-50 mg total) by mouth at bedtime as needed for sleep. 03/07/23   Sharlene Dory, DO      Allergies    Naproxen    Review of Systems   Review of Systems  Constitutional:  Negative for activity change, appetite change and fever.  HENT:  Negative for congestion and rhinorrhea.   Respiratory:  Negative for cough, chest tightness and shortness of breath.   Gastrointestinal:  Negative for abdominal pain, nausea and vomiting.  Genitourinary:  Negative for dysuria and hematuria.  Musculoskeletal:  Positive for back pain.  Skin:  Negative for rash.  Neurological:  Negative for dizziness, weakness and headaches.   all other systems are negative except as noted in the HPI and PMH.    Physical Exam Updated Vital Signs BP 122/68 (BP Location: Left Arm)   Pulse 60   Temp (!) 96.8 F (36 C) (Temporal)   Resp 16   Ht 5\' 10"   (1.778 m)   Wt 63 kg   SpO2 99%   BMI 19.94 kg/m  Physical Exam Vitals and nursing note reviewed.  Constitutional:      General: He is not in acute distress.    Appearance: He is well-developed.  HENT:     Head: Normocephalic and atraumatic.     Mouth/Throat:     Pharynx: No oropharyngeal exudate.  Eyes:     Conjunctiva/sclera: Conjunctivae normal.     Pupils: Pupils are equal, round, and reactive to light.  Neck:     Comments: No meningismus. Cardiovascular:     Rate and Rhythm: Normal rate and regular rhythm.     Heart sounds: Normal heart sounds. No murmur heard. Pulmonary:     Effort: Pulmonary effort is normal. No respiratory distress.     Breath sounds: Normal breath sounds.  Abdominal:     Palpations: Abdomen is soft.     Tenderness: There is no abdominal tenderness. There is no guarding or rebound.  Musculoskeletal:        General: Tenderness present. Normal range of motion.     Cervical back: Normal range of motion and neck supple.     Comments: Right paraspinal lumbar tenderness  5/5 strength in bilateral lower extremities. Ankle plantar and dorsiflexion intact. Great toe extension intact bilaterally. +2 DP and PT pulses. +2 patellar reflexes  bilaterally. Normal gait.   Skin:    General: Skin is warm.  Neurological:     Mental Status: He is alert and oriented to person, place, and time.     Cranial Nerves: No cranial nerve deficit.     Motor: No abnormal muscle tone.     Coordination: Coordination normal.     Comments:  5/5 strength throughout. CN 2-12 intact.Equal grip strength.   Psychiatric:        Behavior: Behavior normal.     ED Results / Procedures / Treatments   Labs (all labs ordered are listed, but only abnormal results are displayed) Labs Reviewed  URINALYSIS, ROUTINE W REFLEX MICROSCOPIC    EKG None  Radiology DG Lumbar Spine Complete  Result Date: 05/14/2023 CLINICAL DATA:  Low back pain for 2 days after lifting weights and wrestling  EXAM: LUMBAR SPINE - COMPLETE 4 VIEW COMPARISON:  None Available. FINDINGS: There is no evidence of lumbar spine fracture. Incomplete fusion of the L1 transverse processes. Alignment is normal. Intervertebral disc spaces are maintained. IMPRESSION: Negative. Electronically Signed   By: Tiburcio Pea M.D.   On: 05/14/2023 05:00    Procedures Procedures    Medications Ordered in ED Medications  lidocaine (LIDODERM) 5 % 1 patch (has no administration in time range)  methocarbamol (ROBAXIN) tablet 500 mg (has no administration in time range)  ketorolac (TORADOL) 30 MG/ML injection 15 mg (has no administration in time range)    ED Course/ Medical Decision Making/ A&P                                 Medical Decision Making Amount and/or Complexity of Data Reviewed Labs: ordered. Decision-making details documented in ED Course. Radiology: ordered and independent interpretation performed. Decision-making details documented in ED Course. ECG/medicine tests: ordered and independent interpretation performed. Decision-making details documented in ED Course.  Risk Prescription drug management.   Right-sided low back pain after wrestling.  Suspect likely musculoskeletal pain.  Intact distal strength, sensation, pulses and reflexes.  Low suspicion for cord compression or cauda equina.  Urinalysis negative.  No hematuria.  Lumbar spine x-ray negative for fracture or dislocation.  Results reviewed and interpreted by me.  Suspect likely musculoskeletal back strain or sprain.  Low suspicion for spinal cord injury or vascular pathology.  Patient to be treated with supportively with anti-inflammatories, lidocaine patches and muscle relaxers.  Follow-up with PCP.  Return precautions discussed        Final Clinical Impression(s) / ED Diagnoses Final diagnoses:  Strain of lumbar region, initial encounter    Rx / DC Orders ED Discharge Orders     None         Hephzibah Strehle, Jeannett Senior,  MD 05/14/23 [phone]

## 2023-08-01 ENCOUNTER — Ambulatory Visit
Admission: EM | Admit: 2023-08-01 | Discharge: 2023-08-01 | Disposition: A | Discharge status: Home / Self Care | Payer: 59 | Attending: Internal Medicine | Admitting: Internal Medicine

## 2023-08-01 DIAGNOSIS — L600 Ingrowing nail: Secondary | ICD-10-CM | POA: Diagnosis not present

## 2023-08-01 MED ORDER — CEPHALEXIN 500 MG PO CAPS: 500.0000 mg | ORAL_CAPSULE | Freq: Three times a day (TID) | ORAL | 0 refills | Status: DC

## 2023-08-01 MED ORDER — IBUPROFEN 800 MG PO TABS: 800.0000 mg | ORAL_TABLET | Freq: Three times a day (TID) | ORAL | 0 refills | Status: DC | PRN

## 2023-08-01 NOTE — ED Triage Notes (Signed)
Pt c/o right great ingrown toenail x ~2 weeks-states he has trimmed nail and used soaks-states he feels left great toe is developing same x 2-3 days-NAD-steady gait

## 2023-08-01 NOTE — ED Provider Notes (Signed)
[facility]  Note:  This document was prepared using Conservation officer, historic buildings and may include unintentional dictation errors.  MRN: [id number] DOB: 2000-10-06  Subjective:   Alex Wade is a 22 y.o. male presenting for 3-week history of persistent worsening right ingrown toenail with weeping, swelling and drainage.  Has tried to trim the nail and used soaks but is worsening.  He is having similar symptoms develop for the left great toenail.    No current facility-administered medications for this encounter.  Current Outpatient Medications:    ibuprofen (ADVIL) 800 MG tablet, Take 1 tablet (800 mg total) by mouth every 8 (eight) hours as needed for moderate pain., Disp: 21 tablet, Rfl: 0   lidocaine (LIDODERM) 5 %, Place 1 patch onto the skin daily. Remove & Discard patch within 12 hours or as directed by MD, Disp: 14 patch, Rfl: 0   methocarbamol (ROBAXIN) 500 MG tablet, Take 1 tablet (500 mg total) by mouth every 8 (eight) hours as needed for muscle spasms., Disp: 20 tablet, Rfl: 0   traZODone (DESYREL) 50 MG tablet, Take 0.5-1 tablets (25-50 mg total) by mouth at bedtime as needed for sleep., Disp: 30 tablet, Rfl: 3   Allergies  Allergen Reactions   Naproxen Nausea And Vomiting    Past Medical History:  Diagnosis Date   No known health problems      Past Surgical History:  Procedure Laterality Date   HIP SURGERY Right 2020   labral   OTHER SURGICAL HISTORY Left 2003   tibial fx    Family History  Problem Relation Age of Onset   Cancer Neg Hx    Heart disease Neg Hx     Social History   Tobacco Use   Smoking status: Former    Types: E-cigarettes   Smokeless tobacco: Never  Vaping Use   Vaping status: Some Days  Substance Use Topics   Alcohol use: Yes    Comment: occ   Drug use: Never    ROS   Objective:   Vitals: BP 104/70 (BP Location: Right Arm)   Pulse 73   Temp 98.3 F (36.8 C) (Oral)   Resp 16   SpO2 98%    Physical Exam Constitutional:      General: He is not in acute distress.    Appearance: Normal appearance. He is well-developed and normal weight. He is not ill-appearing, toxic-appearing or diaphoretic.  HENT:     Head: Normocephalic and atraumatic.     Right Ear: External ear normal.     Left Ear: External ear normal.     Nose: Nose normal.     Mouth/Throat:     Pharynx: Oropharynx is clear.  Eyes:     General: No scleral icterus.       Right eye: No discharge.        Left eye: No discharge.     Extraocular Movements: Extraocular movements intact.  Cardiovascular:     Rate and Rhythm: Normal rate.  Pulmonary:     Effort: Pulmonary effort is normal.  Musculoskeletal:     Cervical back: Normal range of motion.       Feet:  Neurological:     Mental Status: He is alert and oriented to person, place, and time.  Psychiatric:        Mood and Affect: Mood normal.        Behavior: Behavior normal.        Thought Content: Thought content normal.  Judgment: Judgment normal.     Assessment and Plan :   PDMP not reviewed this encounter.  1. Ingrown toenail of right foot with infection     Referral placed to podiatry.  Recommend starting cephalexin, ibuprofen for pain relief.  Use warm soaks.  Counseled patient on potential for adverse effects with medications prescribed/recommended today, ER and return-to-clinic precautions discussed, patient verbalized understanding.    Wallis Bamberg, PA-C 08/01/23 1114
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
## 2023-08-06 ENCOUNTER — Ambulatory Visit: Payer: 59 | Admitting: Podiatry

## 2023-08-06 DIAGNOSIS — L600 Ingrowing nail: Secondary | ICD-10-CM | POA: Diagnosis not present

## 2023-08-06 NOTE — Patient Instructions (Signed)

## 2023-08-06 NOTE — Progress Notes (Signed)
Subjective:   Patient ID: Alex Wade, male   DOB: 22 y.o.   MRN: [id number]   HPI Patient presents stating he has chronic ingrown toenails on both big toes and they have been sore and making it hard for him to wear shoe gear comfortably.  Has tried to trim and soak them patient does not smoke likes to be active   Review of Systems  All other systems reviewed and are negative.       Objective:  Physical Exam Vitals and nursing note reviewed.  Constitutional:      Appearance: He is well-developed.  Pulmonary:     Effort: Pulmonary effort is normal.  Musculoskeletal:        General: Normal range of motion.  Skin:    General: Skin is warm.  Neurological:     Mental Status: He is alert.     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  I then removed the medial borders exposed matrix applied phenol 3 applications 30 seconds to each border followed by alcohol lavage and sterile dressing.  Leave dressing on 24 hours take it off earlier if throbbing were to occur encouraged to call questions concerns

## 2023-08-14 ENCOUNTER — Ambulatory Visit: Payer: 59 | Admitting: Podiatry

## 2023-08-15 ENCOUNTER — Encounter: Payer: Self-pay | Admitting: Family Medicine

## 2023-08-15 ENCOUNTER — Ambulatory Visit (INDEPENDENT_AMBULATORY_CARE_PROVIDER_SITE_OTHER): Payer: 59 | Admitting: Family Medicine

## 2023-08-15 VITALS — BP 122/64 | HR 67 | Temp 98.0°F | Resp 16 | Ht 70.0 in | Wt 140.4 lb

## 2023-08-15 DIAGNOSIS — Z0001 Encounter for general adult medical examination with abnormal findings: Secondary | ICD-10-CM

## 2023-08-15 DIAGNOSIS — G4700 Insomnia, unspecified: Secondary | ICD-10-CM | POA: Diagnosis not present

## 2023-08-15 DIAGNOSIS — Z0000 Encounter for general adult medical examination without abnormal findings: Secondary | ICD-10-CM

## 2023-08-15 MED ORDER — BELSOMRA 20 MG PO TABS: 20.0000 mg | ORAL_TABLET | Freq: Every evening | ORAL | 0 refills | Status: DC | PRN

## 2023-08-15 NOTE — Patient Instructions (Addendum)
Give [place] 2-3 business days to get the results of your labs back.   Keep the diet clean and stay active.  Do monthly self testicular checks in the shower. You are feeling for lumps/bumps that don't belong. If you feel anything like this, let me know!  Please get me a copy of your advanced directive form at your convenience.   If you do not hear anything about your referral in the next 1-2 weeks, call our office and ask for an update.  Let us know if you need anything.

## 2023-08-15 NOTE — Progress Notes (Signed)
CC: Well visit  Well Male Alex Wade is here for a complete physical.   His last physical was >1 year ago.  Current diet: in general, a "healthy" diet.   Current exercise: lifting wts, wrestling Weight trend: stable Fatigue out of ordinary? No. Tired from sleep issues.  Seat belt? Yes.   Advanced directive? No  Health maintenance Tetanus- Yes HIV- Yes Hep C- Yes  Insomnia Several years over difficulty falling asleep and staying asleep. Failed trazodone, Elavil, Remeron. Exercise as above. Never saw sleep team. Mood stable overall.   Past Medical History:  Diagnosis Date   No known health problems      Past Surgical History:  Procedure Laterality Date   HIP SURGERY Right 2020   labral   OTHER SURGICAL HISTORY Left 2003   tibial fx    Medications  Takes no meds routinely.   Allergies Allergies  Allergen Reactions   Naproxen Nausea And Vomiting    Family History Family History  Problem Relation Age of Onset   Cancer Neg Hx    Heart disease Neg Hx     Review of Systems: Constitutional: no fevers or chills Eye:  no recent significant change in vision Ear/Nose/Mouth/Throat:  Ears:  no hearing loss Nose/Mouth/Throat:  no complaints of nasal congestion, no sore throat Cardiovascular:  no chest pain Respiratory:  no shortness of breath Gastrointestinal:  no abdominal pain, no change in bowel habits GU:  Male: negative for dysuria Musculoskeletal/Extremities:  no pain of the joints Integumentary (Skin/Breast):  no abnormal skin lesions reported Neurologic:  no headaches Endocrine: No unexpected weight changes Hematologic/Lymphatic:  no night sweats  Exam BP 122/64 (BP Location: Left Arm, Patient Position: Sitting, Cuff Size: Normal)   Pulse 67   Temp 98 F (36.7 C) (Oral)   Resp 16   Ht 5\' 10"  (1.778 m)   Wt 140 lb 6.4 oz (63.7 kg)   SpO2 98%   BMI 20.15 kg/m  General:  well developed, well nourished, in no apparent distress Skin:  no significant  moles, warts, or growths Head:  no masses, lesions, or tenderness Eyes:  pupils equal and round, sclera anicteric without injection Ears:  canals without lesions, TMs shiny without retraction, no obvious effusion, no erythema Nose:  nares patent, mucosa normal Throat/Pharynx:  lips and gingiva without lesion; tongue and uvula midline; non-inflamed pharynx; no exudates or postnasal drainage Neck: neck supple without adenopathy, thyromegaly, or masses Lungs:  clear to auscultation, breath sounds equal bilaterally, no respiratory distress Cardio:  regular rate and rhythm, no bruits, no LE edema Abdomen:  abdomen soft, nontender; bowel sounds normal; no masses or organomegaly Genital (male): Deferred Rectal: Deferred Musculoskeletal:  symmetrical muscle groups noted without atrophy or deformity Extremities:  no clubbing, cyanosis, or edema, no deformities, no skin discoloration Neuro:  gait normal; deep tendon reflexes normal and symmetric Psych: well oriented with normal range of affect and appropriate judgment/insight  Assessment and Plan  Well adult exam - Plan: CBC, Comprehensive metabolic panel, Lipid panel  Insomnia, unspecified type - Plan: Suvorexant (BELSOMRA) 20 MG TABS, Ambulatory referral to Pulmonology   Well 22 y.o. male. Counseled on diet and exercise. Self testicular exams recommended at least monthly.  Advanced directive form provided today.  Other orders as above. Insomnia: Chronic, uncontrolled.  He has failed mirtazapine, amitriptyline, trazodone.  Various OTC meds did not help.  He does not feel well rested.  Will get him set up with the sleep team for their opinion.  Will  start Belsomra 20 mg nightly.  He will let me know if there are cost issues.  Could consider atypical antipsychotic as well. Follow up in 1 year pending the above workup. The patient voiced understanding and agreement to the plan.  Jilda Roche [place], DO 08/15/23 10:36 AM

## 2023-09-21 ENCOUNTER — Other Ambulatory Visit: Payer: Self-pay | Admitting: Family Medicine

## 2023-09-21 DIAGNOSIS — G4700 Insomnia, unspecified: Secondary | ICD-10-CM

## 2023-09-22 MED ORDER — BELSOMRA 20 MG PO TABS: 20.0000 mg | ORAL_TABLET | Freq: Every evening | ORAL | 0 refills | Status: DC | PRN

## 2023-09-22 NOTE — Telephone Encounter (Signed)
 Requesting: Belsomra 20 mg Contract: N/A UDS: N/A Last Visit: 08/15/2023 Next Visit: 08/16/2024 Last Refill: 08/15/2023   Please Advise

## 2023-11-07 ENCOUNTER — Other Ambulatory Visit: Payer: Self-pay | Admitting: Family Medicine

## 2023-11-07 DIAGNOSIS — G4700 Insomnia, unspecified: Secondary | ICD-10-CM

## 2023-11-07 MED ORDER — BELSOMRA 20 MG PO TABS: 20.0000 mg | ORAL_TABLET | Freq: Every evening | ORAL | 5 refills | Status: DC | PRN

## 2024-04-27 ENCOUNTER — Ambulatory Visit: Admitting: Family Medicine

## 2024-08-16 ENCOUNTER — Other Ambulatory Visit: Payer: Self-pay

## 2024-08-16 ENCOUNTER — Encounter: Payer: Self-pay | Admitting: Family Medicine

## 2024-08-16 ENCOUNTER — Ambulatory Visit: Payer: 59 | Admitting: Family Medicine

## 2024-08-16 ENCOUNTER — Ambulatory Visit: Payer: Self-pay | Admitting: Family Medicine

## 2024-08-16 VITALS — BP 124/68 | HR 80 | Temp 98.0°F | Resp 16 | Ht 70.0 in | Wt 140.0 lb

## 2024-08-16 DIAGNOSIS — R748 Abnormal levels of other serum enzymes: Secondary | ICD-10-CM

## 2024-08-16 DIAGNOSIS — Z0000 Encounter for general adult medical examination without abnormal findings: Secondary | ICD-10-CM

## 2024-08-16 LAB — LIPID PANEL
Cholesterol: 103 mg/dL (ref 0–200)
HDL: 46.7 mg/dL (ref >39.00)
LDL Cholesterol: 46 mg/dL (ref 0–99)
NonHDL: 56.5
Total CHOL/HDL Ratio: 2
Triglycerides: 54 mg/dL (ref 0.0–149.0)
VLDL: 10.8 mg/dL (ref 0.0–40.0)

## 2024-08-16 LAB — COMPREHENSIVE METABOLIC PANEL WITH GFR
ALT: 36 U/L (ref 0–53)
AST: 50 U/L — ABNORMAL HIGH (ref 0–37)
Albumin: 4.5 g/dL (ref 3.5–5.2)
Alkaline Phosphatase: 70 U/L (ref 39–117)
BUN: 11 mg/dL (ref 6–23)
CO2: 30 meq/L (ref 19–32)
Calcium: 9.6 mg/dL (ref 8.4–10.5)
Chloride: 103 meq/L (ref 96–112)
Creatinine, Ser: 0.97 mg/dL (ref 0.40–1.50)
GFR: 108.96 mL/min (ref >60.00)
Glucose, Bld: 92 mg/dL (ref 70–99)
Potassium: 3.8 meq/L (ref 3.5–5.1)
Sodium: 141 meq/L (ref 135–145)
Total Bilirubin: 0.6 mg/dL (ref 0.2–1.2)
Total Protein: 6.9 g/dL (ref 6.0–8.3)

## 2024-08-16 LAB — CBC
HCT: 43.3 % (ref 39.0–52.0)
Hemoglobin: 14.7 g/dL (ref 13.0–17.0)
MCHC: 34 g/dL (ref 30.0–36.0)
MCV: 90.2 fl (ref 78.0–100.0)
Platelets: 157 K/uL (ref 150.0–400.0)
RBC: 4.8 Mil/uL (ref 4.22–5.81)
RDW: 13.4 % (ref 11.5–15.5)
WBC: 6.2 K/uL (ref 4.0–10.5)

## 2024-08-16 LAB — TESTOSTERONE: Testosterone: 463.06 ng/dL (ref 300.00–890.00)

## 2024-08-16 NOTE — Progress Notes (Signed)
 Chief Complaint  Patient presents with   Annual Exam    CPE    Well Male Alex Wade is here for a complete physical.   His last physical was >1 year ago.  Current diet: in general, a healthy diet.   Current exercise: wrestling Weight trend: stable Fatigue out of ordinary? No. Seat belt? Yes.   Advanced directive? No  Health maintenance Tetanus- Yes HIV- Yes Hep C- Yes  Past Medical History:  Diagnosis Date   No known health problems      Past Surgical History:  Procedure Laterality Date   HIP SURGERY Right 2020   labral   OTHER SURGICAL HISTORY Left 2003   tibial fx    Medications  Takes no medicines routinely.     Allergies Allergies  Allergen Reactions   Naproxen Nausea And Vomiting    Family History Family History  Problem Relation Age of Onset   Cancer Neg Hx    Heart disease Neg Hx     Review of Systems: Constitutional: no fevers or chills Eye:  no recent significant change in vision Ear/Nose/Mouth/Throat:  Ears:  no hearing loss Nose/Mouth/Throat:  no complaints of nasal congestion, no sore throat Cardiovascular:  no chest pain Respiratory:  no shortness of breath Gastrointestinal:  no abdominal pain, no change in bowel habits GU:  Male: negative for dysuria Musculoskeletal/Extremities:  no pain of the joints Integumentary (Skin/Breast):  no abnormal skin lesions reported Neurologic:  no headaches Endocrine: No unexpected weight changes Hematologic/Lymphatic:  no night sweats  Exam BP 124/68 (BP Location: Left Arm, Patient Position: Sitting)   Pulse 80   Temp 98 F (36.7 C) (Oral)   Resp 16   Ht 5' 10 (1.778 m)   Wt 140 lb (63.5 kg)   SpO2 100%   BMI 20.09 kg/m  General:  well developed, well nourished, in no apparent distress Skin:  no significant moles, warts, or growths Head:  no masses, lesions, or tenderness Eyes:  pupils equal and round, sclera anicteric without injection Ears:  canals without lesions, TMs shiny without  retraction, no obvious effusion, no erythema Nose:  nares patent, mucosa normal Throat/Pharynx:  lips and gingiva without lesion; tongue and uvula midline; non-inflamed pharynx; no exudates or postnasal drainage Neck: neck supple without adenopathy, thyromegaly, or masses Lungs:  clear to auscultation, breath sounds equal bilaterally, no respiratory distress Cardio:  regular rate and rhythm, no bruits, no LE edema Abdomen:  abdomen soft, nontender; bowel sounds normal; no masses or organomegaly Genital (male): Deferred Rectal: Deferred Musculoskeletal:  symmetrical muscle groups noted without atrophy or deformity Extremities:  no clubbing, cyanosis, or edema, no deformities, no skin discoloration Neuro:  gait normal; deep tendon reflexes normal and symmetric Psych: well oriented with normal range of affect and appropriate judgment/insight  Assessment and Plan  Well adult exam - Plan: CBC, Comprehensive metabolic panel with GFR, Lipid panel, Testosterone    Well 23 y.o. male. Counseled on diet and exercise. Self testicular exams recommended at least monthly.  Advanced directive form provided today.  Flu shot politely declined.  Other orders as above. Follow up in 1 year pending the above workup. The patient voiced understanding and agreement to the plan.  Alex [place], DO 08/16/24 7:57 AM

## 2024-08-16 NOTE — Patient Instructions (Addendum)
 Give us  2-3 business days to get the results of your labs back.   Keep the diet clean and stay active.  Do monthly self testicular checks in the shower. You are feeling for lumps/bumps that don't belong. If you feel anything like this, let me know!  Please get me a copy of your advanced directive form at your convenience.   Send me a message if you are interested in seeing a sleep specialist.   Let us  know if you need anything.

## 2024-08-16 NOTE — Addendum Note (Signed)
 Addended by: Skyanne Welle M on: 08/16/2024 04:24 PM   Modules accepted: Orders

## 2024-09-03 ENCOUNTER — Ambulatory Visit: Payer: Self-pay | Admitting: Family Medicine

## 2024-09-03 ENCOUNTER — Other Ambulatory Visit (INDEPENDENT_AMBULATORY_CARE_PROVIDER_SITE_OTHER)

## 2024-09-03 DIAGNOSIS — R748 Abnormal levels of other serum enzymes: Secondary | ICD-10-CM | POA: Diagnosis not present

## 2024-09-03 LAB — HEPATIC FUNCTION PANEL
ALT: 26 U/L (ref 3–53)
AST: 23 U/L (ref 5–37)
Albumin: 4.6 g/dL (ref 3.5–5.2)
Alkaline Phosphatase: 73 U/L (ref 39–117)
Bilirubin, Direct: 0.1 mg/dL (ref 0.1–0.3)
Total Bilirubin: 0.4 mg/dL (ref 0.2–1.2)
Total Protein: 7.1 g/dL (ref 6.0–8.3)

## 2025-08-22 ENCOUNTER — Encounter: Admitting: Family Medicine
